# Patient Record
Sex: Female | Born: 2000 | Race: White | Marital: Single | Smoking: Never smoker
Health system: Southern US, Community
[De-identification: ages and names within clinical notes are randomized; demographics above are authoritative.]

## PROBLEM LIST (undated history)

## (undated) DIAGNOSIS — K900 Celiac disease: Secondary | ICD-10-CM

## (undated) DIAGNOSIS — L309 Dermatitis, unspecified: Secondary | ICD-10-CM

## (undated) DIAGNOSIS — R143 Flatulence: Secondary | ICD-10-CM

## (undated) DIAGNOSIS — R6251 Failure to thrive (child): Secondary | ICD-10-CM

## (undated) DIAGNOSIS — N390 Urinary tract infection, site not specified: Secondary | ICD-10-CM

## (undated) DIAGNOSIS — R1031 Right lower quadrant pain: Secondary | ICD-10-CM

## (undated) HISTORY — DX: Urinary tract infection, site not specified: N39.0

## (undated) HISTORY — DX: Flatulence: R14.3

## (undated) HISTORY — DX: Dermatitis, unspecified: L30.9

## (undated) HISTORY — DX: Right lower quadrant pain: R10.31

## (undated) HISTORY — DX: Celiac disease: K90.0

## (undated) HISTORY — DX: Failure to thrive (child): R62.51

---
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
## 2019-07-06 ENCOUNTER — Other Ambulatory Visit: Payer: Self-pay | Admitting: General Practice

## 2019-07-06 DIAGNOSIS — Z20822 Contact with and (suspected) exposure to covid-19: Secondary | ICD-10-CM

## 2019-07-08 LAB — NOVEL CORONAVIRUS, NAA: SARS-CoV-2, NAA: NOT DETECTED

## 2019-07-09 ENCOUNTER — Telehealth: Payer: Self-pay | Admitting: General Practice

## 2019-07-09 NOTE — Telephone Encounter (Signed)
Patient called in and received her covid test result  °

## 2020-12-09 ENCOUNTER — Emergency Department: Payer: Federal, State, Local not specified - PPO

## 2020-12-09 ENCOUNTER — Other Ambulatory Visit: Payer: Self-pay

## 2020-12-09 DIAGNOSIS — R0781 Pleurodynia: Secondary | ICD-10-CM | POA: Diagnosis present

## 2020-12-09 DIAGNOSIS — R091 Pleurisy: Secondary | ICD-10-CM | POA: Insufficient documentation

## 2020-12-09 NOTE — ED Triage Notes (Signed)
Pt presents to ER c/o right side rib pain.  Pt states pain is worse when stretching and taking a deep breath.  Pt states she was sick recently and was coughing a lot.  Pt states pain has been going on for several days now and would like to have it checked out.  Pt A&O x4 at this time.  No breathing diff noted.

## 2020-12-10 ENCOUNTER — Emergency Department
Admission: EM | Admit: 2020-12-10 | Discharge: 2020-12-10 | Disposition: A | Discharge status: Home / Self Care | Payer: Federal, State, Local not specified - PPO | Attending: Emergency Medicine | Admitting: Emergency Medicine

## 2020-12-10 DIAGNOSIS — R091 Pleurisy: Secondary | ICD-10-CM

## 2020-12-10 HISTORY — DX: Celiac disease: K90.0

## 2020-12-10 LAB — TROPONIN I (HIGH SENSITIVITY)
Troponin I (High Sensitivity): <2 ng/L (ref <18)
Troponin I (High Sensitivity): <2 ng/L (ref <18)

## 2020-12-10 LAB — COMPREHENSIVE METABOLIC PANEL
ALT: 23 U/L (ref 0–44)
AST: 27 U/L (ref 15–41)
Albumin: 4.7 g/dL (ref 3.5–5.0)
Alkaline Phosphatase: 61 U/L (ref 38–126)
Anion gap: 6 (ref 5–15)
BUN: 8 mg/dL (ref 6–20)
CO2: 26 mmol/L (ref 22–32)
Calcium: 9.6 mg/dL (ref 8.9–10.3)
Chloride: 104 mmol/L (ref 98–111)
Creatinine, Ser: 0.76 mg/dL (ref 0.44–1.00)
GFR, Estimated: >60 mL/min (ref >60)
Glucose, Bld: 134 mg/dL — ABNORMAL HIGH (ref 70–99)
Potassium: 3.9 mmol/L (ref 3.5–5.1)
Sodium: 136 mmol/L (ref 135–145)
Total Bilirubin: 0.7 mg/dL (ref 0.3–1.2)
Total Protein: 8.2 g/dL — ABNORMAL HIGH (ref 6.5–8.1)

## 2020-12-10 LAB — CBC WITH DIFFERENTIAL/PLATELET
Abs Immature Granulocytes: 0.03 10*3/uL (ref 0.00–0.07)
Basophils Absolute: 0 10*3/uL (ref 0.0–0.1)
Basophils Relative: 0 %
Eosinophils Absolute: 0.2 10*3/uL (ref 0.0–0.5)
Eosinophils Relative: 2 %
HCT: 43.6 % (ref 36.0–46.0)
Hemoglobin: 14.4 g/dL (ref 12.0–15.0)
Immature Granulocytes: 0 %
Lymphocytes Relative: 29 %
Lymphs Abs: 2.8 10*3/uL (ref 0.7–4.0)
MCH: 29.6 pg (ref 26.0–34.0)
MCHC: 33 g/dL (ref 30.0–36.0)
MCV: 89.5 fL (ref 80.0–100.0)
Monocytes Absolute: 0.5 10*3/uL (ref 0.1–1.0)
Monocytes Relative: 5 %
Neutro Abs: 6.4 10*3/uL (ref 1.7–7.7)
Neutrophils Relative %: 64 %
Platelets: 394 10*3/uL (ref 150–400)
RBC: 4.87 MIL/uL (ref 3.87–5.11)
RDW: 12.3 % (ref 11.5–15.5)
WBC: 10 10*3/uL (ref 4.0–10.5)
nRBC: 0 % (ref 0.0–0.2)

## 2020-12-10 LAB — D-DIMER, QUANTITATIVE: D-Dimer, Quant: 0.33 ug/mL-FEU (ref 0.00–0.50)

## 2020-12-10 MED ORDER — DEXAMETHASONE SODIUM PHOSPHATE 10 MG/ML IJ SOLN
10.0000 mg | Freq: Once | INTRAMUSCULAR | Status: AC
  Administered 2020-12-10: 10 mg via INTRAVENOUS
  Filled 2020-12-10: qty 1

## 2020-12-10 MED ORDER — KETOROLAC TROMETHAMINE 30 MG/ML IJ SOLN
10.0000 mg | Freq: Once | INTRAMUSCULAR | Status: AC
  Administered 2020-12-10: 9.9 mg via INTRAVENOUS
  Filled 2020-12-10: qty 1

## 2020-12-10 MED ORDER — IBUPROFEN 600 MG PO TABS: 600.0000 mg | ORAL_TABLET | Freq: Three times a day (TID) | ORAL | 0 refills | Status: AC | PRN

## 2020-12-10 MED ORDER — HYDROCODONE-ACETAMINOPHEN 5-325 MG PO TABS: 1.0000 | ORAL_TABLET | Freq: Four times a day (QID) | ORAL | 0 refills | Status: AC | PRN

## 2020-12-10 NOTE — ED Provider Notes (Signed)
[facility] Emergency Department Provider Note   ____________________________________________   Event Date/Time   First MD Initiated Contact with Patient 12/10/20 [phone]     (approximate)  I have reviewed the triage vital signs and the nursing notes.   HISTORY  Chief Complaint Pleurisy    HPI Amber Wood is a 20 y.o. female who presents to the ED with a chief complaint of right-sided rib pain.  Patient was sick several weeks ago with coughing and sinus infection; treated with Augmentin.  Recent trip to [place] for spring break.  Reports a several day history of right-sided sharp, nonradiating rib pain which is worse on movement, especially stretching and deep inspiration.  Denies current fever, chills, cough, shortness of breath, abdominal pain, nausea, vomiting or dizziness.  Denies Covid concerns.     Past Medical History:  Diagnosis Date  . Celiac disease     There are no problems to display for this patient.   Prior to Admission medications   Medication Sig Start Date End Date Taking? Authorizing Provider  HYDROcodone-acetaminophen (NORCO) 5-325 MG tablet Take 1 tablet by mouth every 6 (six) hours as needed for moderate pain. 12/10/20  Yes Irean Hong, MD  ibuprofen (ADVIL) 600 MG tablet Take 1 tablet (600 mg total) by mouth every 8 (eight) hours as needed. 12/10/20  Yes Irean Hong, MD    Allergies Patient has no known allergies.  No family history on file.  Social History Social History   Tobacco Use  . Smoking status: Never Smoker  . Smokeless tobacco: Never Used  Substance Use Topics  . Alcohol use: Yes    Comment: ocasional   . Drug use: Never    Review of Systems  Constitutional: No fever/chills Eyes: No visual changes. ENT: No sore throat. Cardiovascular: Positive for right ribs pain. Respiratory: Denies shortness of breath. Gastrointestinal: No abdominal pain.  No nausea, no vomiting.  No diarrhea.  No  constipation. Genitourinary: Negative for dysuria. Musculoskeletal: Negative for back pain. Skin: Negative for rash. Neurological: Negative for headaches, focal weakness or numbness.   ____________________________________________   PHYSICAL EXAM:  VITAL SIGNS: ED Triage Vitals  Enc Vitals Group     BP 12/09/20 2206 (!) 151/110     Pulse Rate 12/09/20 2206 (!) 117     Resp 12/09/20 2206 17     Temp 12/09/20 2206 97.9 F (36.6 C)     Temp Source 12/09/20 2206 Oral     SpO2 12/09/20 2206 100 %     Weight 12/09/20 2207 101 lb (45.8 kg)     Height 12/09/20 2207 5\' 2"  (1.575 m)     Head Circumference --      Peak Flow --      Pain Score 12/09/20 2206 8     Pain Loc --      Pain Edu? --      Excl. in GC? --     Constitutional: Alert and oriented. Well appearing and in mild acute distress. Eyes: Conjunctivae are normal. PERRL. EOMI. Head: Atraumatic. Nose: No congestion/rhinnorhea. Mouth/Throat: Mucous membranes are moist.   Neck: No stridor.   Cardiovascular: Normal rate, regular rhythm. Grossly normal heart sounds.  Good peripheral circulation. Respiratory: Normal respiratory effort.  No retractions. Lungs CTAB.  No crepitus.  No splinting.  Right lateral ribs tender to palpation and pain with movement of trunk. Gastrointestinal: Soft and nontender. No distention. No abdominal bruits. No CVA tenderness. Musculoskeletal: No lower extremity tenderness nor edema.  No joint effusions. Neurologic:  Normal speech and language. No gross focal neurologic deficits are appreciated. No gait instability. Skin:  Skin is warm, dry and intact. No rash noted. Psychiatric: Mood and affect are normal. Speech and behavior are normal.  ____________________________________________   LABS (all labs ordered are listed, but only abnormal results are displayed)  Labs Reviewed  COMPREHENSIVE METABOLIC PANEL - Abnormal; Notable for the following components:      Result Value   Glucose, Bld 134  (*)    Total Protein 8.2 (*)    All other components within normal limits  CBC WITH DIFFERENTIAL/PLATELET  D-DIMER, QUANTITATIVE  TROPONIN I (HIGH SENSITIVITY)  TROPONIN I (HIGH SENSITIVITY)   ____________________________________________  EKG  ED ECG REPORT I, Deiontae Rabel J, the attending physician, personally viewed and interpreted this ECG.   Date: 12/10/2020  EKG Time: 0145  Rate: 75  Rhythm: normal EKG, normal sinus rhythm  Axis: Normal  Intervals:none  ST&T Change: Nonspecific  ____________________________________________  RADIOLOGY I, Adonai Helzer J, personally viewed and evaluated these images (plain radiographs) as part of my medical decision making, as well as reviewing the written report by the radiologist.  ED MD interpretation: No acute cardiopulmonary process  Official radiology report(s): DG Chest 2 View  Result Date: 12/09/2020 CLINICAL DATA:  Right rib pain, pleurisy EXAM: CHEST - 2 VIEW COMPARISON:  None. FINDINGS: No consolidation, features of edema, pneumothorax, or effusion. Pulmonary vascularity is normally distributed. The cardiomediastinal contours are unremarkable. No acute osseous or soft tissue abnormality. IMPRESSION: No acute cardiopulmonary abnormality. Electronically Signed   By: Kreg Shropshire M.D.   On: 12/09/2020 22:32    ____________________________________________   PROCEDURES  Procedure(s) performed (including Critical Care):  .1-3 Lead EKG Interpretation Performed by: Irean Hong, MD Authorized by: Irean Hong, MD     Interpretation: normal     ECG rate:  75   ECG rate assessment: normal     Rhythm: sinus rhythm     Ectopy: none     Conduction: normal   Comments:     Placed on cardiac monitor to evaluate for arrhythmias     ____________________________________________   INITIAL IMPRESSION / ASSESSMENT AND PLAN / ED COURSE  As part of my medical decision making, I reviewed the following data within the electronic medical  record:  Nursing notes reviewed and incorporated, Labs reviewed, EKG interpreted, Old chart reviewed, Radiograph reviewed and Notes from prior ED visits     20 year old female presenting with right rib pain, recent respiratory illness and international travel. Differential diagnosis includes, but is not limited to, ACS, aortic dissection, pulmonary embolism, cardiac tamponade, pneumothorax, pneumonia, pericarditis, myocarditis, GI-related causes including esophagitis/gastritis, and musculoskeletal chest wall pain.    Laboratory results including troponin, D-dimer and EKG unremarkable.  Will administer Decadron and Toradol for pleurisy.  Check repeat troponin.  Clinical Course as of 12/10/20 0700  Wed Dec 10, 2020  0532 Repeat troponin remains unremarkable.  Will discharge home on NSAIDs, analgesia and patient will follow up with her PCP.  Strict return precautions given.  Patient verbalizes understanding and agrees with plan of care. [JS]  [id number] Patient sleeping.  Updated her on repeat negative troponin result.  Will discharge home with prescriptions for as needed NSAIDs and analgesics.  Strict return precautions given.  Patient verbalizes understanding agrees with plan of care. [JS]    Clinical Course User Index [JS] Irean Hong, MD     ____________________________________________   FINAL CLINICAL IMPRESSION(S) / ED DIAGNOSES  Final diagnoses:  Pleurisy     ED Discharge Orders         Ordered    ibuprofen (ADVIL) 600 MG tablet  Every 8 hours PRN        12/10/20 0533    HYDROcodone-acetaminophen (NORCO) 5-325 MG tablet  Every 6 hours PRN        12/10/20 0533          *Please note:  Lovada Barwick was evaluated in Emergency Department on 12/10/2020 for the symptoms described in the history of present illness. She was evaluated in the context of the global COVID-19 pandemic, which necessitated consideration that the patient might be at risk for infection with the SARS-CoV-2 virus  that causes COVID-19. Institutional protocols and algorithms that pertain to the evaluation of patients at risk for COVID-19 are in a state of rapid change based on information released by regulatory bodies including the CDC and federal and state organizations. These policies and algorithms were followed during the patient's care in the ED.  Some ED evaluations and interventions may be delayed as a result of limited staffing during and the pandemic.*   Note:  This document was prepared using Dragon voice recognition software and may include unintentional dictation errors.   Irean Hong, MD 12/10/20 [phone]

## 2020-12-10 NOTE — Discharge Instructions (Signed)
1.  You may take pain medicines as needed (Motrin/Norco #15). 2.  Apply moist heat to affected area several times daily. 3.  Return to the ER for worsening symptoms, persistent vomiting, difficulty breathing or other concerns. 

## 2022-09-05 IMAGING — CR DG CHEST 2V
1 series · 2 of 2 positions shown · non-contrast
Comparison: None.

CLINICAL DATA: Right rib pain, pleurisy

EXAM:
CHEST - 2 VIEW

[Series 1: dg chest 2 view · 0.14mm/px · 2 of 2 slices shown]
[im 1/2]
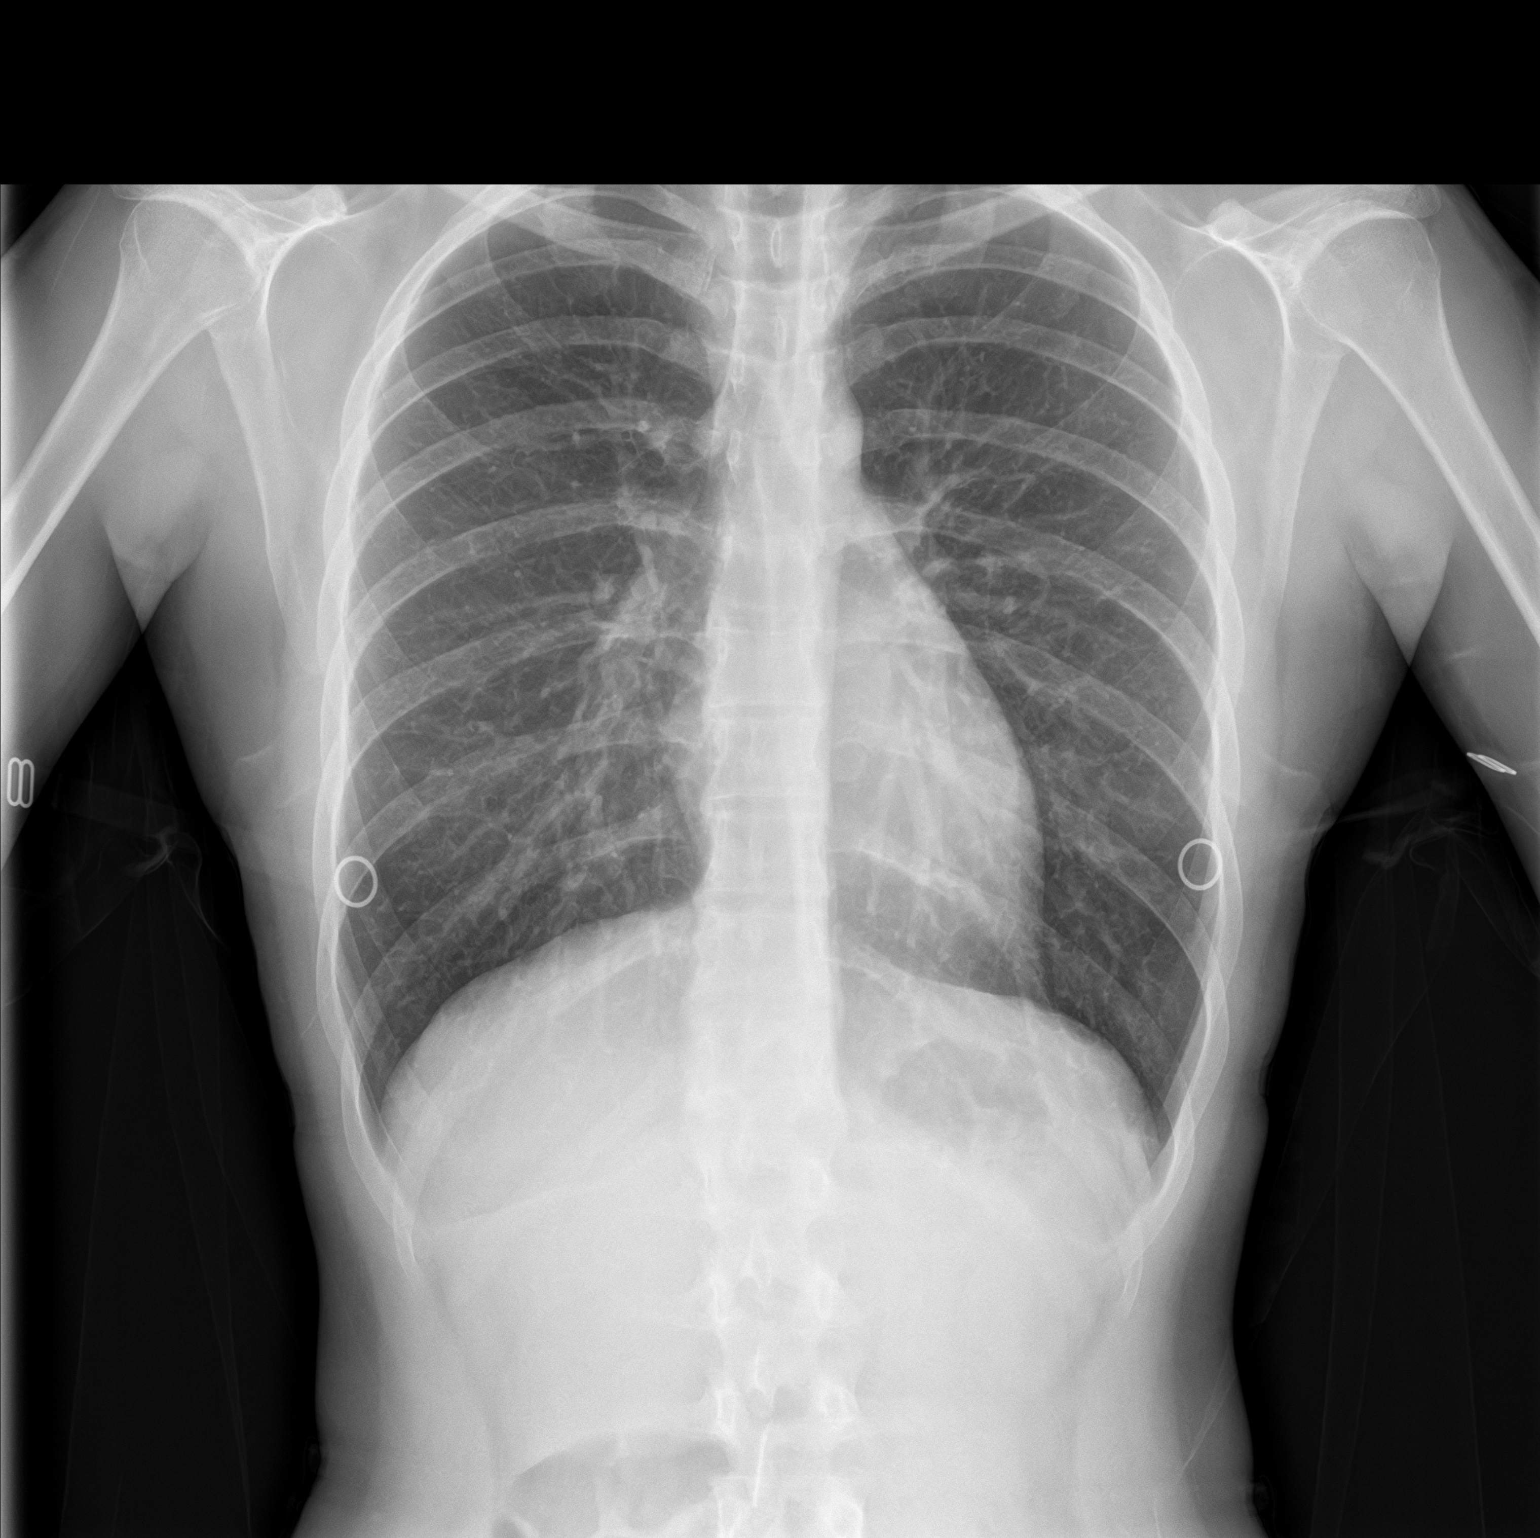
[im 2/2]
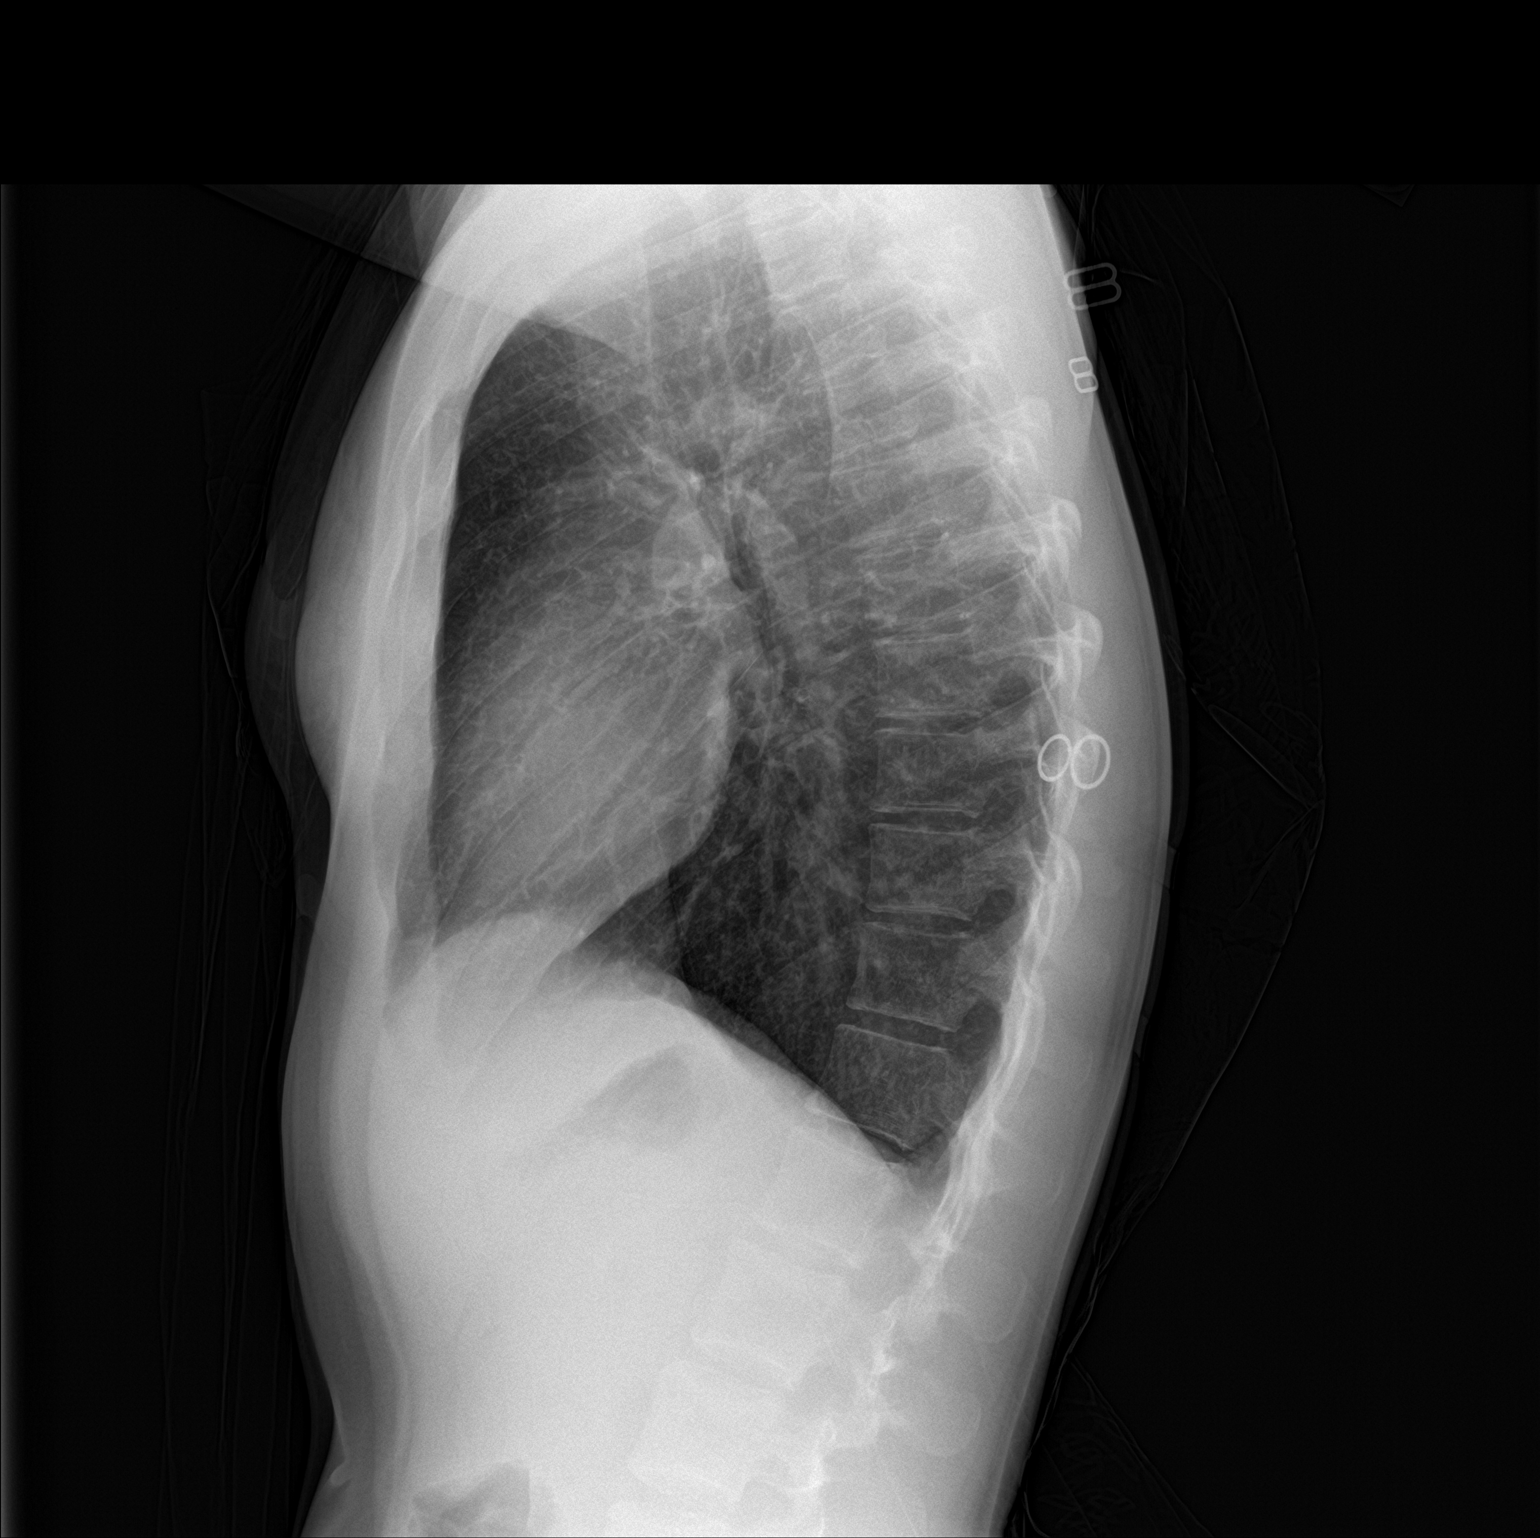

[2 of 2 positions shown; findings below may reference images not displayed]

FINDINGS: No consolidation, features of edema, pneumothorax, or effusion.
Pulmonary vascularity is normally distributed. The cardiomediastinal
contours are unremarkable. No acute osseous or soft tissue
abnormality.
IMPRESSION: No acute cardiopulmonary abnormality.
